# Patient Record
Sex: Female | Born: 1970 | Race: White | Hispanic: No
Health system: Southern US, Community
[De-identification: ages and names within clinical notes are randomized; demographics above are authoritative.]

---

## 1999-08-24 ENCOUNTER — Emergency Department (HOSPITAL_COMMUNITY): Admission: EM | Admit: 1999-08-24 | Discharge: 1999-08-25 | Payer: Self-pay | Admitting: Emergency Medicine

## 2001-05-22 ENCOUNTER — Other Ambulatory Visit: Admission: RE | Admit: 2001-05-22 | Discharge: 2001-05-22 | Payer: Self-pay | Admitting: Internal Medicine

## 2001-10-11 ENCOUNTER — Ambulatory Visit (HOSPITAL_COMMUNITY): Admission: RE | Admit: 2001-10-11 | Discharge: 2001-10-11 | Payer: Self-pay | Admitting: Internal Medicine

## 2001-10-24 ENCOUNTER — Encounter: Admission: RE | Admit: 2001-10-24 | Discharge: 2001-10-24 | Payer: Self-pay | Admitting: Internal Medicine

## 2001-10-24 ENCOUNTER — Encounter: Payer: Self-pay | Admitting: Internal Medicine

## 2001-11-28 ENCOUNTER — Ambulatory Visit (HOSPITAL_BASED_OUTPATIENT_CLINIC_OR_DEPARTMENT_OTHER): Admission: RE | Admit: 2001-11-28 | Discharge: 2001-11-28 | Payer: Self-pay | Admitting: Internal Medicine

## 2002-04-22 ENCOUNTER — Encounter: Admission: RE | Admit: 2002-04-22 | Discharge: 2002-04-22 | Payer: Self-pay | Admitting: Surgery

## 2002-04-22 ENCOUNTER — Encounter: Payer: Self-pay | Admitting: Surgery

## 2002-04-29 ENCOUNTER — Inpatient Hospital Stay (HOSPITAL_COMMUNITY): Admission: RE | Admit: 2002-04-29 | Discharge: 2002-05-02 | Payer: Self-pay | Admitting: Surgery

## 2002-04-30 ENCOUNTER — Encounter: Payer: Self-pay | Admitting: Surgery

## 2002-05-06 ENCOUNTER — Emergency Department (HOSPITAL_COMMUNITY): Admission: EM | Admit: 2002-05-06 | Discharge: 2002-05-06 | Payer: Self-pay

## 2003-05-14 ENCOUNTER — Ambulatory Visit (HOSPITAL_BASED_OUTPATIENT_CLINIC_OR_DEPARTMENT_OTHER): Admission: RE | Admit: 2003-05-14 | Discharge: 2003-05-14 | Payer: Self-pay | Admitting: General Surgery

## 2003-05-24 ENCOUNTER — Ambulatory Visit (HOSPITAL_COMMUNITY): Admission: RE | Admit: 2003-05-24 | Discharge: 2003-05-24 | Payer: Self-pay | Admitting: Surgery

## 2003-06-23 ENCOUNTER — Ambulatory Visit (HOSPITAL_BASED_OUTPATIENT_CLINIC_OR_DEPARTMENT_OTHER): Admission: RE | Admit: 2003-06-23 | Discharge: 2003-06-23 | Payer: Self-pay | Admitting: Surgery

## 2003-07-07 ENCOUNTER — Ambulatory Visit (HOSPITAL_COMMUNITY): Admission: RE | Admit: 2003-07-07 | Discharge: 2003-07-07 | Payer: Self-pay | Admitting: Surgery

## 2004-02-24 ENCOUNTER — Emergency Department (HOSPITAL_COMMUNITY): Admission: EM | Admit: 2004-02-24 | Discharge: 2004-02-24 | Payer: Self-pay | Admitting: Family Medicine

## 2004-03-06 ENCOUNTER — Emergency Department (HOSPITAL_COMMUNITY): Admission: EM | Admit: 2004-03-06 | Discharge: 2004-03-06 | Payer: Self-pay | Admitting: Emergency Medicine

## 2004-04-07 ENCOUNTER — Encounter: Admission: RE | Admit: 2004-04-07 | Discharge: 2004-04-07 | Payer: Self-pay | Admitting: Obstetrics and Gynecology

## 2004-04-08 ENCOUNTER — Ambulatory Visit (HOSPITAL_COMMUNITY): Admission: RE | Admit: 2004-04-08 | Discharge: 2004-04-08 | Payer: Self-pay | Admitting: Surgery

## 2004-05-25 ENCOUNTER — Encounter: Admission: RE | Admit: 2004-05-25 | Discharge: 2004-05-25 | Payer: Self-pay | Admitting: Surgery

## 2004-06-06 ENCOUNTER — Emergency Department (HOSPITAL_COMMUNITY): Admission: EM | Admit: 2004-06-06 | Discharge: 2004-06-06 | Payer: Self-pay | Admitting: Family Medicine

## 2004-06-17 ENCOUNTER — Inpatient Hospital Stay (HOSPITAL_COMMUNITY): Admission: EM | Admit: 2004-06-17 | Discharge: 2004-06-24 | Payer: Self-pay | Admitting: Emergency Medicine

## 2004-07-06 ENCOUNTER — Encounter: Admission: RE | Admit: 2004-07-06 | Discharge: 2004-07-06 | Payer: Self-pay | Admitting: Surgery

## 2004-08-09 ENCOUNTER — Ambulatory Visit (HOSPITAL_COMMUNITY): Admission: RE | Admit: 2004-08-09 | Discharge: 2004-08-09 | Payer: Self-pay | Admitting: Surgery

## 2004-08-13 ENCOUNTER — Encounter: Admission: RE | Admit: 2004-08-13 | Discharge: 2004-08-13 | Payer: Self-pay | Admitting: Surgery

## 2006-03-22 ENCOUNTER — Emergency Department (HOSPITAL_COMMUNITY): Admission: EM | Admit: 2006-03-22 | Discharge: 2006-03-22 | Payer: Self-pay | Admitting: Emergency Medicine

## 2006-08-27 IMAGING — CR DG UGI W/ HIGH DENSITY W/KUB
1 series · 1 of 1 positions shown · non-contrast
Comparison: none

CLINICAL DATA: Gastric ulcer seen on endoscopy in [DATE].  History of gastric bypass surgery. 
 UPPER GI W/KUB: 
 The scout KUB demonstrates a normal bowel gas pattern.  Surgical staples are seen in the left side of the abdomen from the previous bypass surgery. 
 The mucosa and motility of the esophagus are normal.  No evidence of hiatal hernia.  The gastric pouch is normal in size and configuration and there is immediate emptying of barium into the small bowel.  The anastomosis appears widely patent.  No evidence of a visible marginal ulcer.  Visualized small bowel is normal.

[view not recorded]
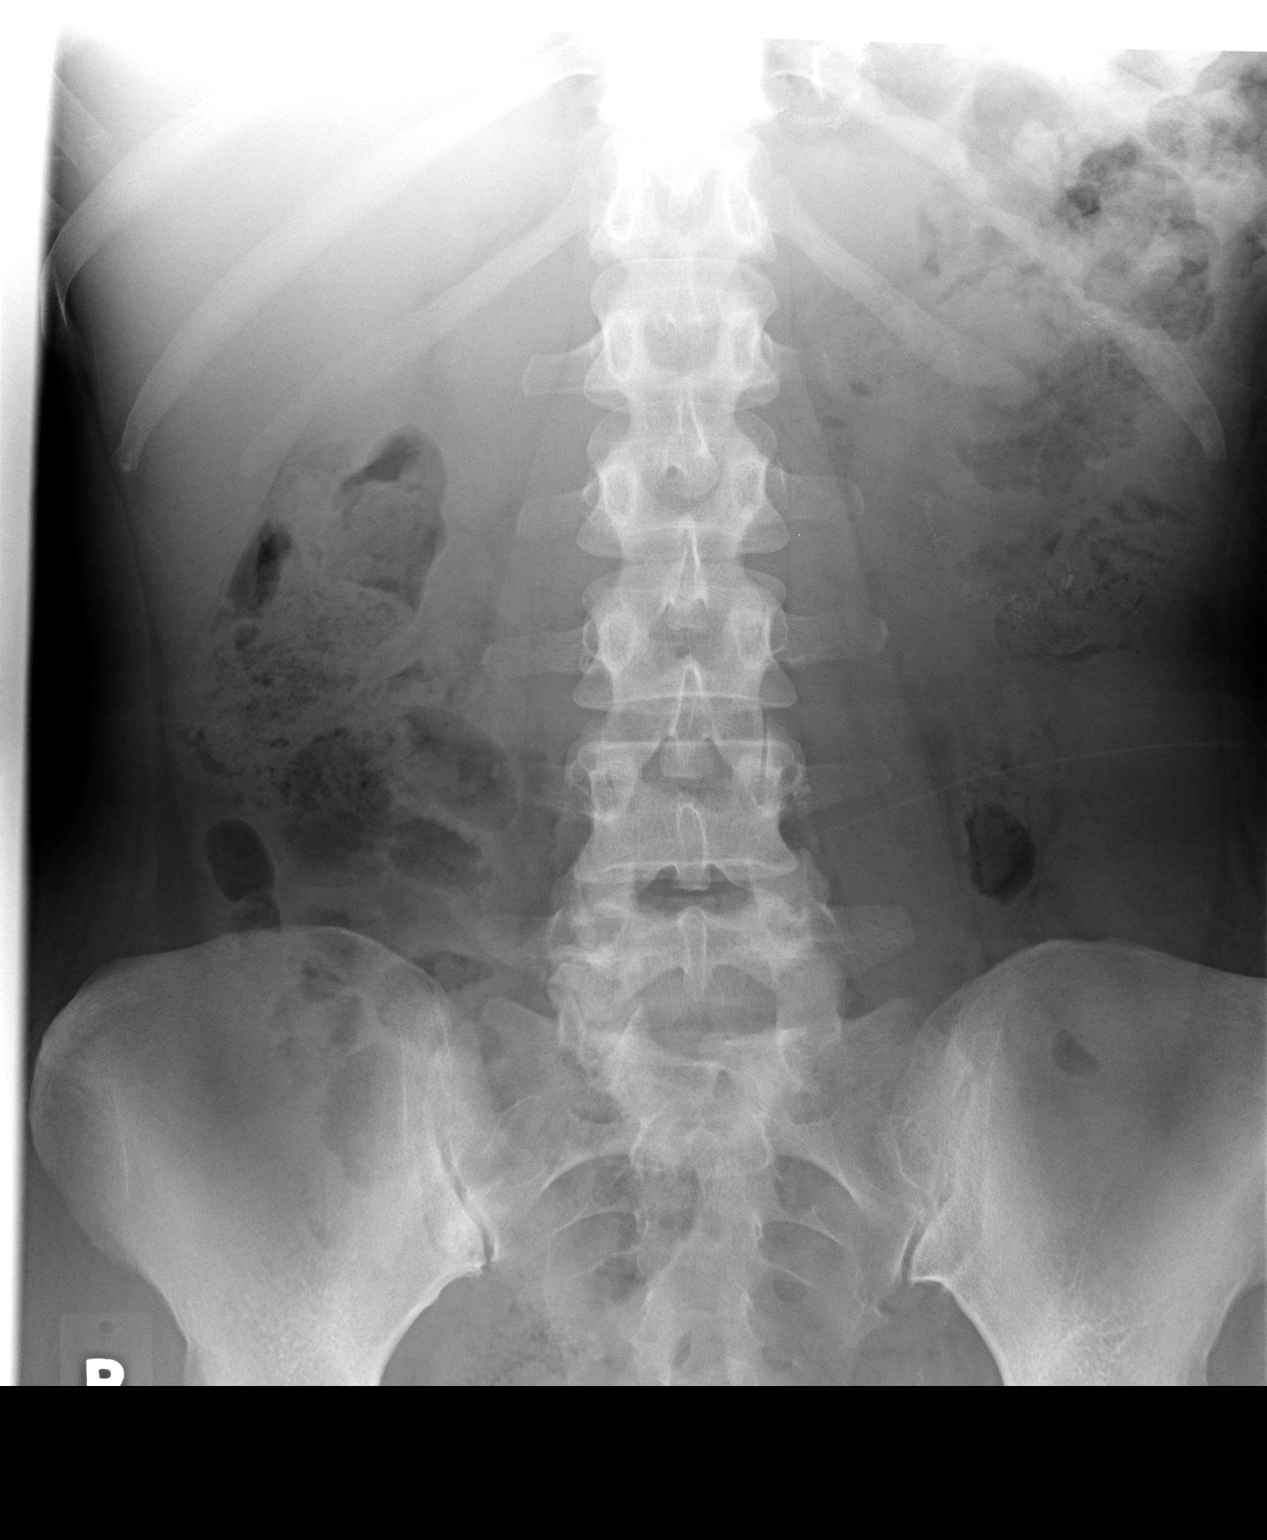

[1 of 1 positions shown; findings below may reference images not displayed]

IMPRESSION: No visible residual ulcer.  The gastric pouch and the anastomosis appear normal.

## 2006-09-08 IMAGING — CR DG SACRUM/COCCYX 2+V
3 series · 3 of 3 positions shown · non-contrast
Comparison: None.

CLINICAL DATA: Fell, landing on coccyx.

SACRUM AND COCCYX - 3 VIEW 06/06/2004:

[view not recorded (1 of 3)]
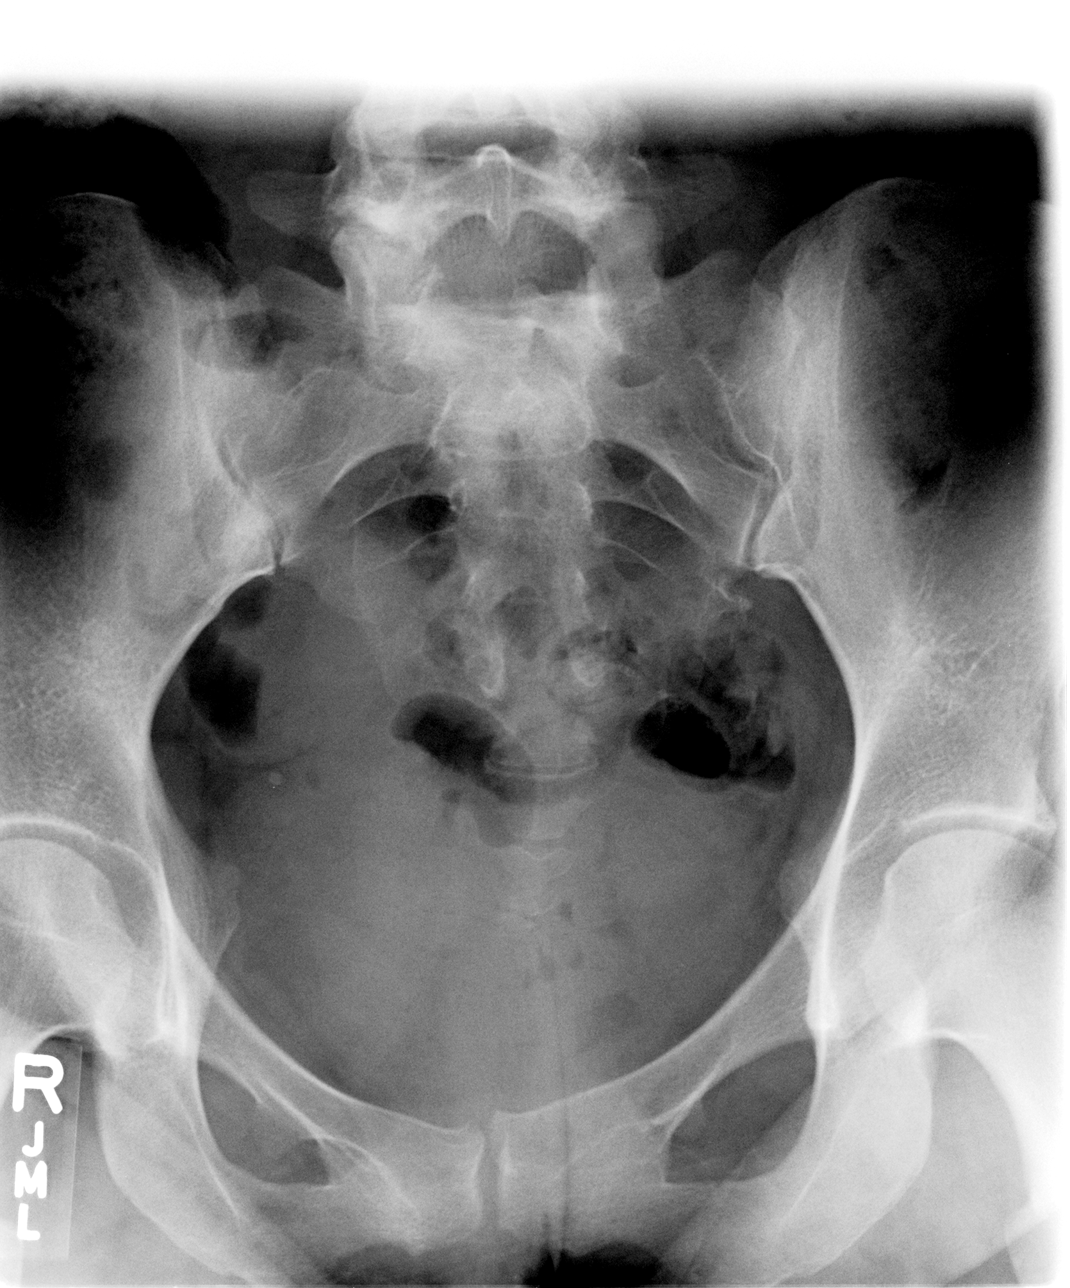

[view not recorded (2 of 3)]
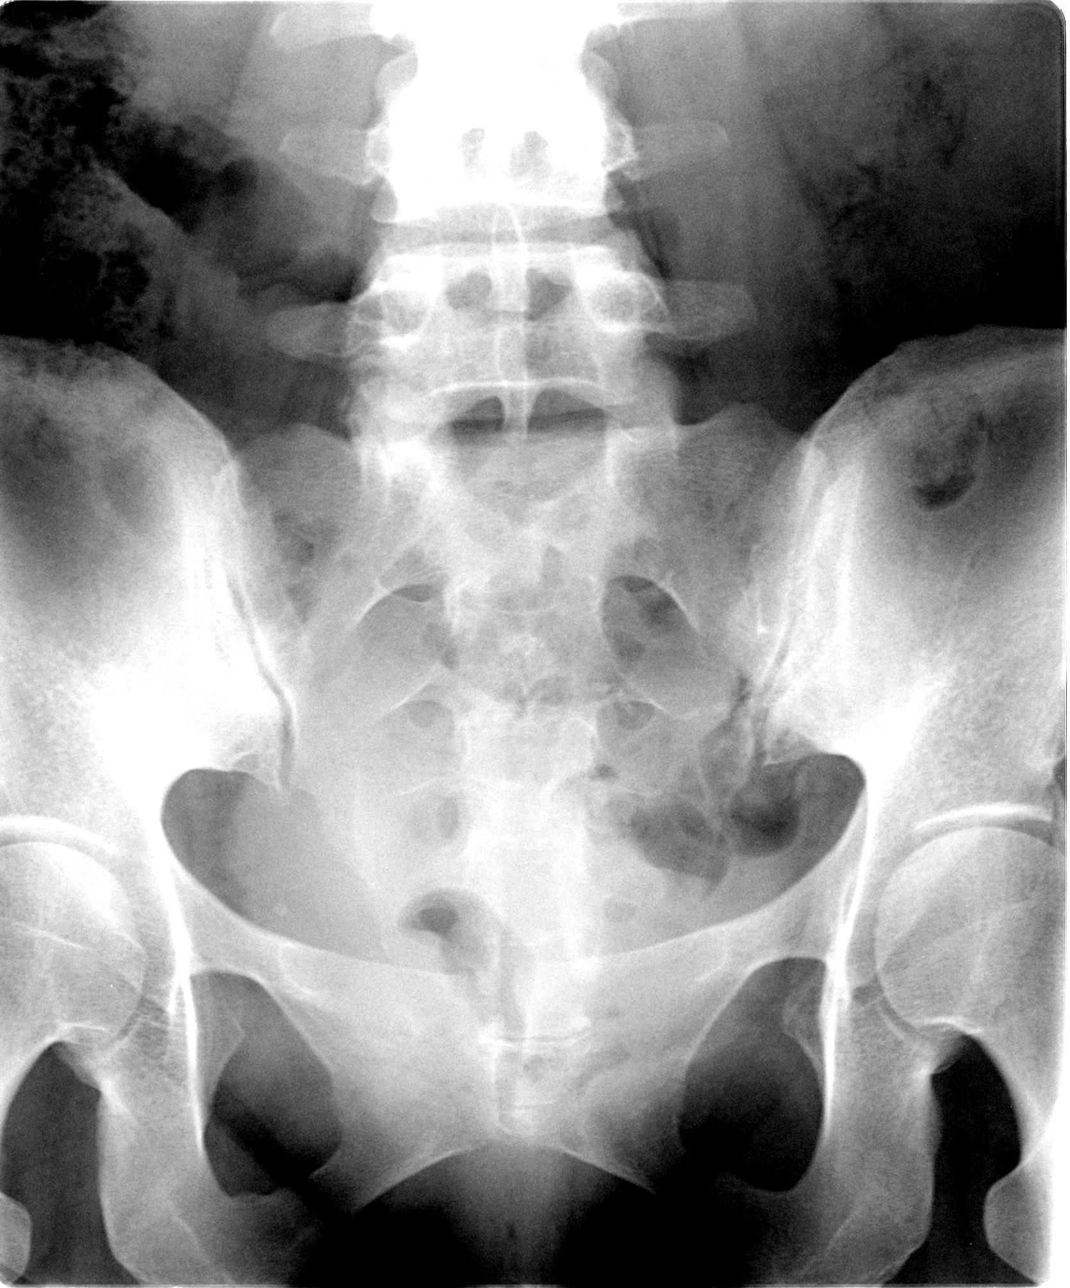

[view not recorded (3 of 3)]
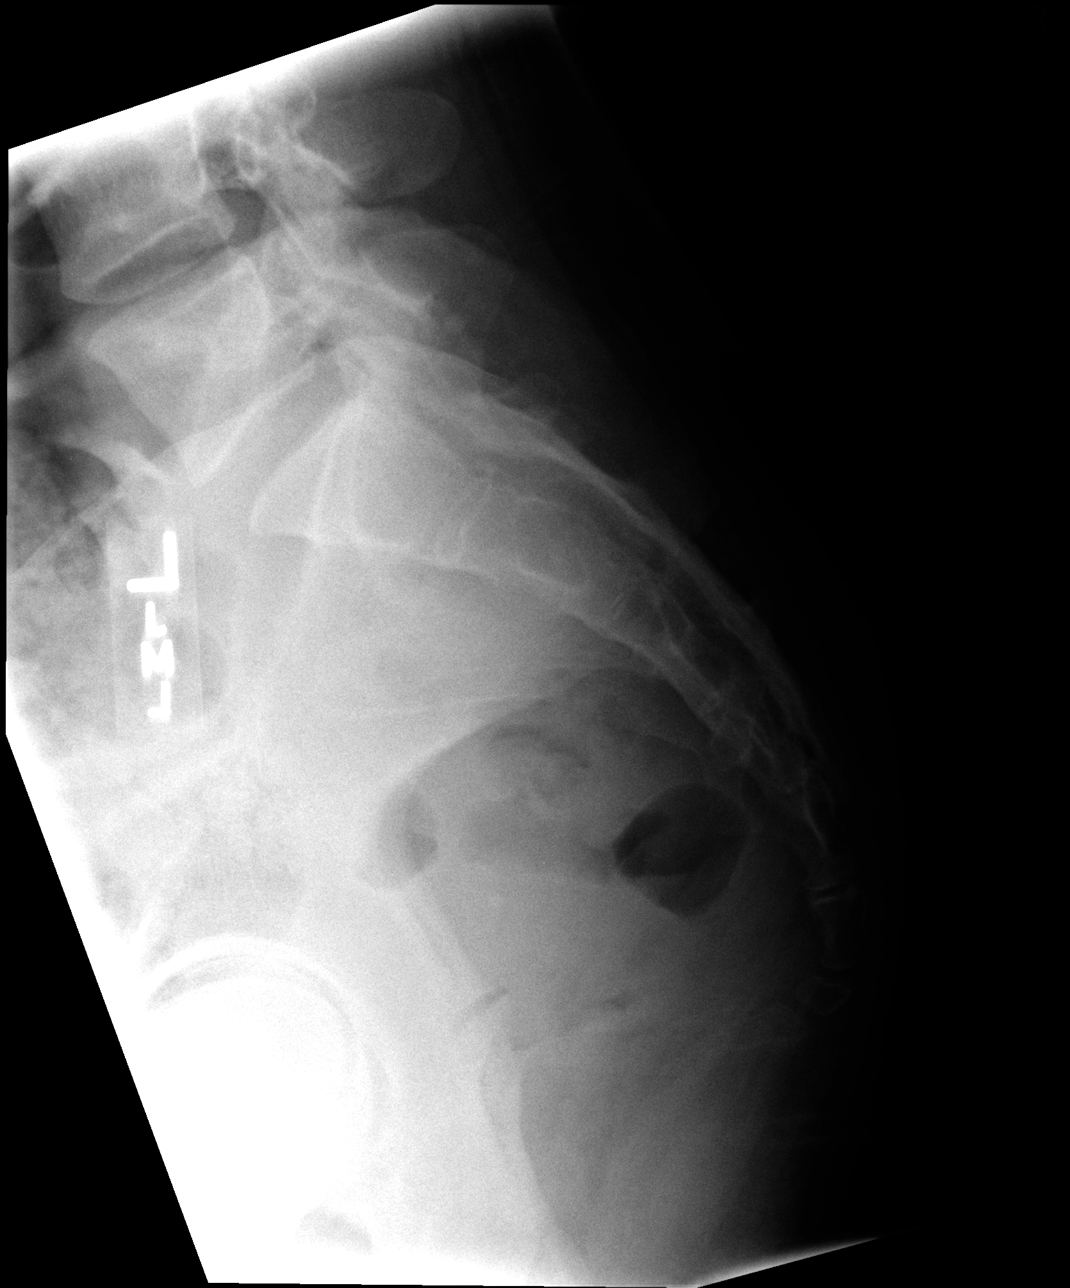

[3 of 3 positions shown; findings below may reference images not displayed]

FINDINGS: There is acute angulation at the level of the third or fourth
coccygeal segment. If the patient is tender, this could certainly represent an
acute injury. The sacrum and coccyx are otherwise unremarkable. Minimal
degenerative changes are present in the sacroiliac joints.
IMPRESSION: 1. Acute angulation at the level of the third or fourth coccygeal segment; if
the patient is tender, this could certainly represent an acute injury. 

2. Minimal degenerative changes are present in the sacroiliac joints.

## 2015-02-25 DIAGNOSIS — Z862 Personal history of diseases of the blood and blood-forming organs and certain disorders involving the immune mechanism: Secondary | ICD-10-CM

## 2015-02-25 DIAGNOSIS — Z9884 Bariatric surgery status: Secondary | ICD-10-CM

## 2015-09-01 DIAGNOSIS — D509 Iron deficiency anemia, unspecified: Secondary | ICD-10-CM

## 2016-11-28 DIAGNOSIS — R6510 Systemic inflammatory response syndrome (SIRS) of non-infectious origin without acute organ dysfunction: Secondary | ICD-10-CM

## 2016-11-28 DIAGNOSIS — R51 Headache: Secondary | ICD-10-CM

## 2019-04-03 DIAGNOSIS — N390 Urinary tract infection, site not specified: Secondary | ICD-10-CM

## 2019-04-03 DIAGNOSIS — A419 Sepsis, unspecified organism: Secondary | ICD-10-CM

## 2019-04-03 DIAGNOSIS — E876 Hypokalemia: Secondary | ICD-10-CM

## 2019-08-02 DIAGNOSIS — I82401 Acute embolism and thrombosis of unspecified deep veins of right lower extremity: Secondary | ICD-10-CM

## 2019-08-02 DIAGNOSIS — D72829 Elevated white blood cell count, unspecified: Secondary | ICD-10-CM

## 2019-08-02 DIAGNOSIS — R509 Fever, unspecified: Secondary | ICD-10-CM

## 2019-08-20 DIAGNOSIS — D509 Iron deficiency anemia, unspecified: Secondary | ICD-10-CM

## 2019-08-20 DIAGNOSIS — I2699 Other pulmonary embolism without acute cor pulmonale: Secondary | ICD-10-CM

## 2020-02-28 ENCOUNTER — Other Ambulatory Visit: Payer: Self-pay

## 2020-02-28 DIAGNOSIS — I2693 Single subsegmental pulmonary embolism without acute cor pulmonale: Secondary | ICD-10-CM

## 2020-02-28 MED ORDER — RIVAROXABAN 20 MG PO TABS: 20.0000 mg | ORAL_TABLET | Freq: Every day | ORAL | 3 refills | Status: DC

## 2020-07-03 ENCOUNTER — Other Ambulatory Visit: Payer: Self-pay

## 2020-07-03 ENCOUNTER — Telehealth: Payer: Self-pay

## 2020-07-03 DIAGNOSIS — I2693 Single subsegmental pulmonary embolism without acute cor pulmonale: Secondary | ICD-10-CM

## 2020-07-03 MED ORDER — RIVAROXABAN 20 MG PO TABS: 20.0000 mg | ORAL_TABLET | Freq: Every day | ORAL | 0 refills | Status: AC

## 2020-07-03 NOTE — Telephone Encounter (Addendum)
I called pt, and notified her of below. She states she doesn't have a PCP @ this time, because she doesn't have any insurance. I told her about the [facility] clinic, and to call them to set up an appt. I told them the [facility] clinic is here I town & can help her once they receive financial documents they require. Pt also states, "I don't have to worry about getting pregnant. I'm too old".      ----- Message from Adah Perl, PA-C sent at 07/03/2020  3:32 PM EDT ----- We no longer see her and should have had her PCP take over prescribing. I can send in 1 refill, but please have her get with her PCP to continue prescribing. Please be sure she knows to avoid pregnancy while taking this medication. Thanks!
